# Patient Record
Sex: Female | Born: 1990
Health system: Southern US, Community
[De-identification: ages and names within clinical notes are randomized; demographics above are authoritative.]

## PROBLEM LIST (undated history)

## (undated) DIAGNOSIS — R569 Unspecified convulsions: Secondary | ICD-10-CM

## (undated) DIAGNOSIS — G40909 Epilepsy, unspecified, not intractable, without status epilepticus: Secondary | ICD-10-CM

---

## 2015-03-14 ENCOUNTER — Emergency Department (HOSPITAL_COMMUNITY)
Admission: EM | Admit: 2015-03-14 | Discharge: 2015-03-14 | Disposition: A | Discharge status: Home / Self Care | Payer: 59 | Attending: Emergency Medicine | Admitting: Emergency Medicine

## 2015-03-14 ENCOUNTER — Encounter (HOSPITAL_COMMUNITY): Payer: Self-pay | Admitting: Emergency Medicine

## 2015-03-14 DIAGNOSIS — G40909 Epilepsy, unspecified, not intractable, without status epilepticus: Secondary | ICD-10-CM | POA: Insufficient documentation

## 2015-03-14 DIAGNOSIS — R197 Diarrhea, unspecified: Secondary | ICD-10-CM | POA: Diagnosis not present

## 2015-03-14 DIAGNOSIS — R112 Nausea with vomiting, unspecified: Secondary | ICD-10-CM | POA: Insufficient documentation

## 2015-03-14 DIAGNOSIS — Z79899 Other long term (current) drug therapy: Secondary | ICD-10-CM | POA: Insufficient documentation

## 2015-03-14 DIAGNOSIS — Z3202 Encounter for pregnancy test, result negative: Secondary | ICD-10-CM | POA: Diagnosis not present

## 2015-03-14 HISTORY — DX: Epilepsy, unspecified, not intractable, without status epilepticus: G40.909

## 2015-03-14 HISTORY — DX: Unspecified convulsions: R56.9

## 2015-03-14 LAB — COMPREHENSIVE METABOLIC PANEL
ALBUMIN: 3.4 g/dL — AB (ref 3.5–5.0)
ALT: 36 U/L (ref 14–54)
AST: 59 U/L — AB (ref 15–41)
Alkaline Phosphatase: 58 U/L (ref 38–126)
Anion gap: 9 (ref 5–15)
BUN: 8 mg/dL (ref 6–20)
CHLORIDE: 105 mmol/L (ref 101–111)
CO2: 23 mmol/L (ref 22–32)
CREATININE: 0.86 mg/dL (ref 0.44–1.00)
Calcium: 9.1 mg/dL (ref 8.9–10.3)
GFR calc Af Amer: >60 mL/min (ref >60)
GFR calc non Af Amer: >60 mL/min (ref >60)
Glucose, Bld: 161 mg/dL — ABNORMAL HIGH (ref 65–99)
POTASSIUM: 3.5 mmol/L (ref 3.5–5.1)
SODIUM: 137 mmol/L (ref 135–145)
Total Bilirubin: 0.7 mg/dL (ref 0.3–1.2)
Total Protein: 6.8 g/dL (ref 6.5–8.1)

## 2015-03-14 LAB — URINALYSIS, ROUTINE W REFLEX MICROSCOPIC
Bilirubin Urine: NEGATIVE
GLUCOSE, UA: NEGATIVE mg/dL
HGB URINE DIPSTICK: NEGATIVE
Ketones, ur: NEGATIVE mg/dL
LEUKOCYTES UA: NEGATIVE
Nitrite: NEGATIVE
PH: 7 (ref 5.0–8.0)
Protein, ur: NEGATIVE mg/dL
Specific Gravity, Urine: 1.014 (ref 1.005–1.030)

## 2015-03-14 LAB — CBC
HEMATOCRIT: 36.2 % (ref 36.0–46.0)
Hemoglobin: 11.8 g/dL — ABNORMAL LOW (ref 12.0–15.0)
MCH: 26.2 pg (ref 26.0–34.0)
MCHC: 32.6 g/dL (ref 30.0–36.0)
MCV: 80.3 fL (ref 78.0–100.0)
Platelets: 206 10*3/uL (ref 150–400)
RBC: 4.51 MIL/uL (ref 3.87–5.11)
RDW: 13.7 % (ref 11.5–15.5)
WBC: 13.6 10*3/uL — AB (ref 4.0–10.5)

## 2015-03-14 LAB — LIPASE, BLOOD: LIPASE: 22 U/L (ref 11–51)

## 2015-03-14 LAB — I-STAT BETA HCG BLOOD, ED (MC, WL, AP ONLY): I-stat hCG, quantitative: <5 m[IU]/mL (ref <5)

## 2015-03-14 MED ORDER — SODIUM CHLORIDE 0.9 % IV BOLUS (SEPSIS)
1000.0000 mL | Freq: Once | INTRAVENOUS | Status: AC
  Administered 2015-03-14: 1000 mL via INTRAVENOUS

## 2015-03-14 MED ORDER — PROCHLORPERAZINE EDISYLATE 5 MG/ML IJ SOLN
10.0000 mg | Freq: Once | INTRAMUSCULAR | Status: AC
Start: 2015-03-14 — End: 2015-03-14
  Administered 2015-03-14: 10 mg via INTRAVENOUS
  Filled 2015-03-14: qty 2

## 2015-03-14 MED ORDER — ONDANSETRON HCL 4 MG/2ML IJ SOLN
4.0000 mg | Freq: Once | INTRAMUSCULAR | Status: AC | PRN
  Administered 2015-03-14: 4 mg via INTRAVENOUS
  Filled 2015-03-14: qty 2

## 2015-03-14 MED ORDER — ONDANSETRON HCL 4 MG/2ML IJ SOLN
4.0000 mg | Freq: Once | INTRAMUSCULAR | Status: AC
  Administered 2015-03-14: 4 mg via INTRAVENOUS
  Filled 2015-03-14: qty 2

## 2015-03-14 MED ORDER — KETOROLAC TROMETHAMINE 30 MG/ML IJ SOLN
30.0000 mg | Freq: Once | INTRAMUSCULAR | Status: AC
  Administered 2015-03-14: 30 mg via INTRAVENOUS
  Filled 2015-03-14: qty 1

## 2015-03-14 MED ORDER — ONDANSETRON 4 MG PO TBDP: 4.0000 mg | ORAL_TABLET | Freq: Three times a day (TID) | ORAL | Status: DC | PRN

## 2015-03-14 NOTE — ED Notes (Signed)
Pt here from home with c/o n/v/d , pt states that it started around midnight after she ate she ate some eggs , pt thought she could sleep it off but could not

## 2015-03-14 NOTE — Discharge Instructions (Signed)

## 2015-03-14 NOTE — ED Provider Notes (Signed)
CSN: [id number]     Arrival date & time 03/14/15  0510 History   First MD Initiated Contact with Patient 03/14/15 0535     Chief Complaint  Patient presents with  . Nausea  . Emesis     (Consider location/radiation/quality/duration/timing/severity/associated sxs/prior Treatment) HPI Comments: The patient is a 24 year old female, she has no significant past medical history other than seizures for which she takes Keppra. She reports that at approximately 12:00 yesterday she developed nausea vomiting and diarrhea almost immediately after eating eggs at a diner. No one else that was eating with her became sick. She has had several recurrent episodes of watery diarrhea since that meal as well as having multiple episodes of vomiting and dry heaving throughout the night. She has mild abdominal cramping, denies dysuria, denies pregnancy, denies fevers or chills. She has no prior abdominal surgery, has had no medications prior to arrival. The symptoms are persistent, nothing seems to make it better or worse.  Patient is a 24 y.o. female presenting with vomiting. The history is provided by the patient.  Emesis   Past Medical History  Diagnosis Date  . Epilepsy (HCC)   . Seizures (HCC)    History reviewed. No pertinent past surgical history. History reviewed. No pertinent family history. Social History  Substance Use Topics  . Smoking status: Never Smoker   . Smokeless tobacco: None  . Alcohol Use: No   OB History    No data available     Review of Systems  Gastrointestinal: Positive for vomiting.  All other systems reviewed and are negative.     Allergies  Review of patient's allergies indicates no known allergies.  Home Medications   Prior to Admission medications   Medication Sig Start Date End Date Taking? Authorizing Provider  levETIRAcetam (KEPPRA) 750 MG tablet Take 750 mg by mouth 2 (two) times daily.   Yes Historical Provider, MD  ondansetron (ZOFRAN ODT) 4 MG  disintegrating tablet Take 1 tablet (4 mg total) by mouth every 8 (eight) hours as needed for nausea. 03/14/15   Eber HongBrian Jacksyn Beeks, MD   BP 110/72 mmHg  Pulse 85  Resp 18  SpO2 100%  LMP 01/24/2015 (Approximate) Physical Exam  Constitutional: She appears well-developed and well-nourished. No distress.  HENT:  Head: Normocephalic and atraumatic.  Mouth/Throat: Oropharynx is clear and moist. No oropharyngeal exudate.  Eyes: Conjunctivae and EOM are normal. Pupils are equal, round, and reactive to light. Right eye exhibits no discharge. Left eye exhibits no discharge. No scleral icterus.  Neck: Normal range of motion. Neck supple. No JVD present. No thyromegaly present.  Cardiovascular: Normal rate, regular rhythm, normal heart sounds and intact distal pulses.  Exam reveals no gallop and no friction rub.   No murmur heard. Pulmonary/Chest: Effort normal and breath sounds normal. No respiratory distress. She has no wheezes. She has no rales.  Abdominal: Soft. Bowel sounds are normal. She exhibits no distension and no mass. There is tenderness ( Scant periumbilical tenderness, no pain at McBurney's point, no Murphy sign).  Musculoskeletal: Normal range of motion. She exhibits no edema or tenderness.  Lymphadenopathy:    She has no cervical adenopathy.  Neurological: She is alert. Coordination normal.  Skin: Skin is warm and dry. No rash noted. No erythema.  Psychiatric: She has a normal mood and affect. Her behavior is normal.  Nursing note and vitals reviewed.   ED Course  Procedures (including critical care time) Labs Review Labs Reviewed  COMPREHENSIVE METABOLIC PANEL - Abnormal; Notable  for the following:    Glucose, Bld 161 (*)    Albumin 3.4 (*)    AST 59 (*)    All other components within normal limits  CBC - Abnormal; Notable for the following:    WBC 13.6 (*)    Hemoglobin 11.8 (*)    All other components within normal limits  URINALYSIS, ROUTINE W REFLEX MICROSCOPIC (NOT AT  [facility]) - Abnormal; Notable for the following:    APPearance CLOUDY (*)    All other components within normal limits  LIPASE, BLOOD  I-STAT BETA HCG BLOOD, ED ([facility], [facility], [facility] ONLY)    Imaging Review No results found. I have personally reviewed and evaluated these images and lab results as part of my medical decision-making.    MDM   Final diagnoses:  Nausea vomiting and diarrhea     The patient's exam is rather unremarkable, she appears to have a gastroenteritis, consider viral, consider food borne illness, check potassium, liver function, blood counts, urinalysis, pregnancy.  Labs normal - no hypokalemia - mild leukocytosis.  Meds given in ED:  Medications  ondansetron (ZOFRAN) injection 4 mg (4 mg Intravenous Given 03/14/15 0534)  sodium chloride 0.9 % bolus 1,000 mL (1,000 mLs Intravenous New Bag/Given 03/14/15 0651)  sodium chloride 0.9 % bolus 1,000 mL (0 mLs Intravenous Stopped 03/14/15 0655)  ondansetron (ZOFRAN) injection 4 mg (4 mg Intravenous Given 03/14/15 0546)  ketorolac (TORADOL) 30 MG/ML injection 30 mg (30 mg Intravenous Given 03/14/15 0615)  prochlorperazine (COMPAZINE) injection 10 mg (10 mg Intravenous Given 03/14/15 0621)    New Prescriptions   ONDANSETRON (ZOFRAN ODT) 4 MG DISINTEGRATING TABLET    Take 1 tablet (4 mg total) by mouth every 8 (eight) hours as needed for nausea.        Eber Hong, MD 03/14/15 [phone]

## 2015-04-11 MED FILL — levETIRAcetam 750 MG TABS: 750 | 30 days supply | Qty: 60 | Fill #4

## 2015-05-11 MED FILL — ONDANSETRON ODT 4 MG TABLET: 4 | 4 days supply | Qty: 10 | Fill #0

## 2015-05-11 MED FILL — levETIRAcetam 750 MG TABS: 750 | 30 days supply | Qty: 60 | Fill #5

## 2015-05-20 DIAGNOSIS — Z113 Encounter for screening for infections with a predominantly sexual mode of transmission: Secondary | ICD-10-CM | POA: Diagnosis not present

## 2015-05-20 DIAGNOSIS — R87612 Low grade squamous intraepithelial lesion on cytologic smear of cervix (LGSIL): Secondary | ICD-10-CM | POA: Diagnosis not present

## 2015-05-20 DIAGNOSIS — Z124 Encounter for screening for malignant neoplasm of cervix: Secondary | ICD-10-CM | POA: Diagnosis not present

## 2015-05-20 DIAGNOSIS — Z01411 Encounter for gynecological examination (general) (routine) with abnormal findings: Secondary | ICD-10-CM | POA: Diagnosis not present

## 2015-05-20 DIAGNOSIS — Z6829 Body mass index (BMI) 29.0-29.9, adult: Secondary | ICD-10-CM | POA: Diagnosis not present

## 2015-05-20 DIAGNOSIS — Z3009 Encounter for other general counseling and advice on contraception: Secondary | ICD-10-CM | POA: Diagnosis not present

## 2015-05-20 MED FILL — TRI-PREVIFEM TABLET: 0.18/0.215/ | 84 days supply | Qty: 84 | Fill #0

## 2015-06-10 MED FILL — levETIRAcetam 750 MG TABS: 750 | 30 days supply | Qty: 60 | Fill #6

## 2015-06-15 DIAGNOSIS — R87612 Low grade squamous intraepithelial lesion on cytologic smear of cervix (LGSIL): Secondary | ICD-10-CM | POA: Diagnosis not present

## 2015-06-15 DIAGNOSIS — N898 Other specified noninflammatory disorders of vagina: Secondary | ICD-10-CM | POA: Diagnosis not present

## 2015-06-15 DIAGNOSIS — A630 Anogenital (venereal) warts: Secondary | ICD-10-CM | POA: Diagnosis not present

## 2015-06-15 DIAGNOSIS — A599 Trichomoniasis, unspecified: Secondary | ICD-10-CM | POA: Diagnosis not present

## 2015-06-15 DIAGNOSIS — N870 Mild cervical dysplasia: Secondary | ICD-10-CM | POA: Diagnosis not present

## 2015-06-15 MED FILL — IMIQUIMOD 5% CREAM PACKET: 5 | 24 days supply | Qty: 12 | Fill #0

## 2015-06-15 MED FILL — metroNIDAZOLE 500 MG TABS: 500 | 1 days supply | Qty: 4 | Fill #0

## 2015-07-11 MED FILL — levETIRAcetam 750 MG TABS: 750 | 30 days supply | Qty: 60 | Fill #7

## 2015-07-12 DIAGNOSIS — Z202 Contact with and (suspected) exposure to infections with a predominantly sexual mode of transmission: Secondary | ICD-10-CM | POA: Diagnosis not present

## 2015-08-01 MED FILL — TRI-PREVIFEM TABLET: 0.18/0.215/ | 84 days supply | Qty: 84 | Fill #1

## 2015-08-02 MED FILL — levETIRAcetam 750 MG TABS: 750 | 30 days supply | Qty: 60 | Fill #8

## 2015-09-09 ENCOUNTER — Emergency Department (HOSPITAL_COMMUNITY): Payer: 59

## 2015-09-09 ENCOUNTER — Encounter (HOSPITAL_COMMUNITY): Payer: Self-pay | Admitting: Emergency Medicine

## 2015-09-09 ENCOUNTER — Emergency Department (HOSPITAL_COMMUNITY)
Admission: EM | Admit: 2015-09-09 | Discharge: 2015-09-10 | Disposition: A | Discharge status: Home / Self Care | Payer: 59 | Attending: Emergency Medicine | Admitting: Emergency Medicine

## 2015-09-09 DIAGNOSIS — G40909 Epilepsy, unspecified, not intractable, without status epilepticus: Secondary | ICD-10-CM | POA: Diagnosis not present

## 2015-09-09 DIAGNOSIS — Z79899 Other long term (current) drug therapy: Secondary | ICD-10-CM | POA: Insufficient documentation

## 2015-09-09 DIAGNOSIS — X500XXA Overexertion from strenuous movement or load, initial encounter: Secondary | ICD-10-CM | POA: Diagnosis not present

## 2015-09-09 DIAGNOSIS — Y9289 Other specified places as the place of occurrence of the external cause: Secondary | ICD-10-CM | POA: Diagnosis not present

## 2015-09-09 DIAGNOSIS — S4991XA Unspecified injury of right shoulder and upper arm, initial encounter: Secondary | ICD-10-CM | POA: Diagnosis present

## 2015-09-09 DIAGNOSIS — M24411 Recurrent dislocation, right shoulder: Secondary | ICD-10-CM | POA: Diagnosis not present

## 2015-09-09 DIAGNOSIS — Y9389 Activity, other specified: Secondary | ICD-10-CM | POA: Insufficient documentation

## 2015-09-09 DIAGNOSIS — S43014A Anterior dislocation of right humerus, initial encounter: Secondary | ICD-10-CM | POA: Diagnosis not present

## 2015-09-09 DIAGNOSIS — S43004A Unspecified dislocation of right shoulder joint, initial encounter: Secondary | ICD-10-CM

## 2015-09-09 DIAGNOSIS — S43005A Unspecified dislocation of left shoulder joint, initial encounter: Secondary | ICD-10-CM | POA: Diagnosis not present

## 2015-09-09 DIAGNOSIS — Y998 Other external cause status: Secondary | ICD-10-CM | POA: Insufficient documentation

## 2015-09-09 MED FILL — levETIRAcetam 750 MG TABS: 750 | 30 days supply | Qty: 60 | Fill #9

## 2015-09-09 NOTE — ED Provider Notes (Signed)
CSN: [id number]     Arrival date & time 09/09/15  2204 History  By signing my name below, I, Phillis HaggisGabriella Gaje, attest that this documentation has been prepared under the direction and in the presence of Benjiman CoreNathan Meghann Landing, MD. Electronically Signed: Phillis HaggisGabriella Gaje, ED Scribe. 09/09/2015. 12:29 AM.   Chief Complaint  Patient presents with  . Shoulder Pain   The history is provided by the patient. No language interpreter was used.  HPI Comments: Courtney Hudson is a 25 y.o. Female with a hx of epilepsy who presents to the Emergency Department complaining of right shoulder pain onset 2 hours ago. Pt reports injuring her right shoulder while lifting weights above her head. She reports a hx of 5 shoulder dislocations on both arms. [place] in [place], but she has not followed up with him in years. Pt last ate at 4 PM. She denies numbness or weakness. Pt denies chance of pregnancy.    Past Medical History  Diagnosis Date  . Epilepsy (HCC)   . Seizures (HCC)    History reviewed. No pertinent past surgical history. No family history on file. Social History  Substance Use Topics  . Smoking status: Never Smoker   . Smokeless tobacco: None  . Alcohol Use: No   OB History    No data available     Review of Systems  Musculoskeletal: Positive for joint swelling and arthralgias.  Neurological: Negative for weakness and numbness.  All other systems reviewed and are negative.  Allergies  Other  Home Medications   Prior to Admission medications   Medication Sig Start Date End Date Taking? Authorizing Provider  levETIRAcetam (KEPPRA) 750 MG tablet Take 1,500 mg by mouth at bedtime.    Yes Historical Provider, MD  Multiple Vitamins-Minerals (HAIR SKIN NAILS PO) Take 1 tablet by mouth daily.   Yes Historical Provider, MD   BP 126/87 mmHg  Pulse 81  Temp(Src) 97.6 F (36.4 C) (Oral)  Resp 18  Wt 155 lb (70.308 kg)  SpO2 100%  LMP 08/31/2015 Physical Exam  Constitutional: She is  oriented to person, place, and time. She appears well-developed and well-nourished.  HENT:  Head: Normocephalic and atraumatic.  Eyes: EOM are normal. Pupils are equal, round, and reactive to light.  Neck: Normal range of motion. Neck supple.  Cardiovascular: Normal rate, regular rhythm and normal heart sounds.  Exam reveals no gallop and no friction rub.   No murmur heard. Pulmonary/Chest: Effort normal and breath sounds normal. She has no wheezes.  Abdominal: Soft. There is no tenderness.  Musculoskeletal:  NVI in right hand; squaring in right shoulder with hollowness laterally and fullness anteriorly; no tenderness over the elbow  Neurological: She is alert and oriented to person, place, and time.  Skin: Skin is warm and dry.  Psychiatric: She has a normal mood and affect. Her behavior is normal.  Nursing note and vitals reviewed.   ED Course  ORTHOPEDIC INJURY TREATMENT Date/Time: 09/10/2015 1:00 AM Performed by: Benjiman CorePICKERING, Rashana Andrew Authorized by: Benjiman CorePICKERING, Ronny Korff Consent: Verbal consent obtained. Written consent obtained. Risks and benefits: risks, benefits and alternatives were discussed Consent given by: patient Required items: required blood products, implants, devices, and special equipment available Patient identity confirmed: verbally with patient and arm band Time out: Immediately prior to procedure a "time out" was called to verify the correct patient, procedure, equipment, support staff and site/side marked as required. Injury location: shoulder Location details: right shoulder Injury type: dislocation Dislocation type: anterior Hill-Sachs deformity: yes Chronicity:  recurrent Pre-procedure neurovascular assessment: neurovascularly intact Pre-procedure distal perfusion: normal Pre-procedure neurological function: normal Pre-procedure range of motion: reduced Local anesthesia used: no Patient sedated: yes Sedation type: moderate (conscious) sedation Sedatives:  propofol Sedation start date/time: 09/10/2015 1:00 AM Vitals: Vital signs were monitored during sedation. Manipulation performed: yes Reduction method: traction and counter traction Reduction successful: yes X-ray confirmed reduction: yes Immobilization: sling Post-procedure neurovascular assessment: post-procedure neurovascularly intact Post-procedure distal perfusion: normal Post-procedure neurological function: normal Post-procedure range of motion: improved Patient tolerance: Patient tolerated the procedure well with no immediate complications   (including critical care time) DIAGNOSTIC STUDIES: Oxygen Saturation is 100% on RA, normal by my interpretation.    COORDINATION OF CARE: 12:24 AM-Discussed treatment plan which includes x-ray and reduction of shoulder with pt at bedside and pt agreed to plan.      Labs Review Labs Reviewed - No data to display  Imaging Review Dg Shoulder Right  09/09/2015  CLINICAL DATA:  Shoulder dislocation while lifting weights. Initial encounter. EXAM: RIGHT SHOULDER - 2+ VIEW COMPARISON:  None. FINDINGS: Anterior glenohumeral dislocation without visible fracture. Please note that the images are labeled as left, but the symptomatic right side was imaged. IMPRESSION: Right anterior glenohumeral dislocation. Electronically Signed   By: Marnee Spring M.D.   On: 09/09/2015 23:02   Dg Shoulder Right Port  09/10/2015  CLINICAL DATA:  Status post reduction of right glenohumeral joint dislocation. Initial encounter. EXAM: PORTABLE RIGHT SHOULDER - 2+ VIEW COMPARISON:  Right shoulder radiographs performed 09/09/2015 FINDINGS: There has been successful reduction of the right humeral head dislocation. An underlying large Hill-Sachs lesion is noted. No osseous Bankart lesion is seen. The right acromioclavicular joint is unremarkable in appearance. The lungs are hypoexpanded. IMPRESSION: Successful reduction of right humeral head dislocation. Underlying large  Hill-Sachs lesion seen. No osseous Bankart lesion identified. Electronically Signed   By: Roanna Raider M.D.   On: 09/10/2015 01:35   I have personally reviewed and evaluated these images and lab results as part of my medical decision-making.   EKG Interpretation None      MDM   Final diagnoses:  Shoulder dislocation, right, initial encounter    Patient with recurrent shoulder dislocation. Previous history of same. Reduced in ER. Discharged home to follow-up with her orthopedic surgeon. I personally performed the services described in this documentation, which was scribed in my presence. The recorded information has been reviewed and considered.  I personally performed the services described in this documentation, which was scribed in my presence. The recorded information has been reviewed and is accurate.   Ni   Benjiman Core, MD 09/10/15 [phone]

## 2015-09-09 NOTE — ED Notes (Signed)
C/o R shoulder pain/ ? Dislocation.  States she injured it lifting weights just pta.

## 2015-09-10 ENCOUNTER — Emergency Department (HOSPITAL_COMMUNITY): Payer: 59

## 2015-09-10 DIAGNOSIS — S43014A Anterior dislocation of right humerus, initial encounter: Secondary | ICD-10-CM | POA: Diagnosis not present

## 2015-09-10 DIAGNOSIS — Z79899 Other long term (current) drug therapy: Secondary | ICD-10-CM | POA: Diagnosis not present

## 2015-09-10 DIAGNOSIS — S43004A Unspecified dislocation of right shoulder joint, initial encounter: Secondary | ICD-10-CM | POA: Diagnosis not present

## 2015-09-10 DIAGNOSIS — G40909 Epilepsy, unspecified, not intractable, without status epilepticus: Secondary | ICD-10-CM | POA: Diagnosis not present

## 2015-09-10 MED ORDER — PROPOFOL 10 MG/ML IV BOLUS
0.5000 mg/kg | Freq: Once | INTRAVENOUS | Status: AC
  Administered 2015-09-10: 30 mg via INTRAVENOUS
  Filled 2015-09-10: qty 20

## 2015-09-10 MED ORDER — PROPOFOL 10 MG/ML IV BOLUS
0.5000 mg/kg | Freq: Once | INTRAVENOUS | Status: DC
  Administered 2015-09-10: 30 mg via INTRAVENOUS

## 2015-09-10 MED ORDER — PROPOFOL 10 MG/ML IV BOLUS
INTRAVENOUS | Status: AC | PRN
  Administered 2015-09-10: 40 mg via INTRAVENOUS

## 2015-09-10 NOTE — Discharge Instructions (Signed)
Shoulder Dislocation °A shoulder dislocation happens when the upper arm bone (humerus) moves out of the shoulder joint. The shoulder joint is the part of the shoulder where the humerus, shoulder blade (scapula), and collarbone (clavicle) meet. °CAUSES °This condition is often caused by: °· A fall. °· A hit to the shoulder. °· A forceful movement of the shoulder. °RISK FACTORS °This condition is more likely to develop in people who play sports. °SYMPTOMS °Symptoms of this condition include: °· Deformity of the shoulder. °· Intense pain. °· Inability to move the shoulder. °· Numbness, weakness, or tingling in your neck or down your arm. °· Bruising or swelling around your shoulder. °DIAGNOSIS °This condition is diagnosed with a physical exam. After the exam, tests may be done to check for related problems. Tests that may be done include: °· X-ray. This may be done to check for broken bones. °· MRI. This may be done to check for damage to the tissues around the shoulder. °· Electromyogram. This may be done to check for nerve damage. °TREATMENT °This condition is treated with a procedure to place the humerus back in the joint. This procedure is called a reduction. There are two types of reduction: °· Closed reduction. In this procedure, the humerus is placed back in the joint without surgery. The health care provider uses his or her hands to guide the bone back into place. °· Open reduction. In this procedure, the humerus is placed back in the joint with surgery. An open reduction may be recommended if: °¨ You have a weak shoulder joint or weak ligaments. °¨ You have had more than one shoulder dislocation. °¨ The nerves or blood vessels around your shoulder have been damaged. °After the humerus is placed back into the joint, your arm will be placed in a splint or sling to prevent it from moving. You will need to wear the splint or sling until your shoulder heals. When the splint or sling is removed, you may have  physical therapy to help improve the range of motion in your shoulder joint. °HOME CARE INSTRUCTIONS °If You Have a Splint or Sling: °· Wear it as told by your health care provider. Remove it only as told by your health care provider. °· Loosen it if your fingers become numb and tingle, or if they turn cold and blue. °· Keep it clean and dry. °Bathing °· Do not take baths, swim, or use a hot tub until your health care provider approves. Ask your health care provider if you can take showers. You may only be allowed to take sponge baths for bathing. °· If your health care provider approves bathing and showering, cover your splint or sling with a watertight plastic bag to protect it from water. Do not let the splint or sling get wet. °Managing Pain, Stiffness, and Swelling °· If directed, apply ice to the injured area. °¨ Put ice in a plastic bag. °¨ Place a towel between your skin and the bag. °¨ Leave the ice on for 20 minutes, 2-3 times per day. °· Move your fingers often to avoid stiffness and to decrease swelling. °· Raise (elevate) the injured area above the level of your heart while you are sitting or lying down. °Driving °· Do not drive while wearing a splint or sling on a hand that you use for driving. °· Do not drive or operate heavy machinery while taking pain medicine. °Activity °· Return to your normal activities as told by your health care provider. Ask your   health care provider what activities are safe for you. °· Perform range-of-motion exercises only as told by your health care provider. °· Exercise your hand by squeezing a soft ball. This helps to decrease stiffness and swelling in your hand and wrist. °General Instructions °· Take over-the-counter and prescription medicines only as told by your health care provider. °· Do not use any tobacco products, including cigarettes, chewing tobacco, or e-cigarettes. Tobacco can delay bone and tissue healing. If you need help quitting, ask your health care  provider. °· Keep all follow-up visits as told by your health care provider. This is important. °SEEK MEDICAL CARE IF: °· Your splint or sling gets damaged. °SEEK IMMEDIATE MEDICAL CARE IF: °· Your pain gets worse rather than better. °· You lose feeling in your arm or hand. °· Your arm or hand becomes white and cold. °  °This information is not intended to replace advice given to you by your health care provider. Make sure you discuss any questions you have with your health care provider. °  °Document Released: 12/19/2000 Document Revised: 12/15/2014 Document Reviewed: 07/19/2014 °Elsevier Interactive Patient Education ©2016 [employer] ° °

## 2015-09-10 NOTE — ED Notes (Signed)
Supplies for sedation at bedside

## 2015-09-10 NOTE — ED Notes (Signed)
Signed consent at bedside.  Airway cart and code cart outside room.  Pt placed on cardiac monitor and CO2 monitor.

## 2015-09-10 NOTE — ED Notes (Signed)
Patient able to ambulate independently  

## 2015-09-10 NOTE — ED Notes (Signed)
MD at bedside. 

## 2015-09-10 NOTE — ED Notes (Signed)
X-ray at bedside

## 2015-09-27 DIAGNOSIS — Z7251 High risk heterosexual behavior: Secondary | ICD-10-CM | POA: Diagnosis not present

## 2015-10-06 MED FILL — levETIRAcetam 750 MG TABS: 750 | 30 days supply | Qty: 60 | Fill #10

## 2015-11-04 MED FILL — levETIRAcetam 750 MG TABS: 750 | 30 days supply | Qty: 60 | Fill #11

## 2015-11-07 MED FILL — TRI-PREVIFEM TABLET: 0.18/0.215/ | 84 days supply | Qty: 84 | Fill #2

## 2015-12-01 MED FILL — levETIRAcetam 750 MG TABS: 750 | 30 days supply | Qty: 60 | Fill #0

## 2015-12-10 DIAGNOSIS — J019 Acute sinusitis, unspecified: Secondary | ICD-10-CM | POA: Diagnosis not present

## 2016-01-05 MED FILL — levETIRAcetam 750 MG TABS: 750 | 30 days supply | Qty: 60 | Fill #1

## 2016-01-27 MED FILL — TRI-PREVIFEM TABLET: 0.18/0.215/ | 84 days supply | Qty: 84 | Fill #3

## 2016-02-03 MED FILL — levETIRAcetam 750 MG TABS: 750 | 30 days supply | Qty: 60 | Fill #2

## 2016-02-13 DIAGNOSIS — G40309 Generalized idiopathic epilepsy and epileptic syndromes, not intractable, without status epilepticus: Secondary | ICD-10-CM | POA: Diagnosis not present

## 2016-02-28 MED FILL — levETIRAcetam 750 MG TABS: 750 | 30 days supply | Qty: 60 | Fill #3

## 2016-03-30 MED FILL — levETIRAcetam 750 MG TABS: 750 | 30 days supply | Qty: 60 | Fill #4

## 2016-04-24 MED FILL — TRI-PREVIFEM TABLET: 0.18/0.215/ | 28 days supply | Qty: 28 | Fill #0

## 2016-04-27 MED FILL — levETIRAcetam 750 MG TABS: 750 | 30 days supply | Qty: 60 | Fill #0

## 2016-05-25 MED FILL — TRI-PREVIFEM TABLET: 0.18/0.215/ | 84 days supply | Qty: 84 | Fill #0

## 2016-05-28 MED FILL — levETIRAcetam 750 MG TABS: 750 | 30 days supply | Qty: 60 | Fill #1

## 2016-06-04 IMAGING — CR DG SHOULDER 2+V PORT*R*
2 series · 2 of 2 positions shown · non-contrast
Comparison: Right shoulder radiographs performed 09/09/2015

CLINICAL DATA: Status post reduction of right glenohumeral joint
dislocation. Initial encounter.

EXAM:
PORTABLE RIGHT SHOULDER - 2+ VIEW

[AP (1 of 2)]
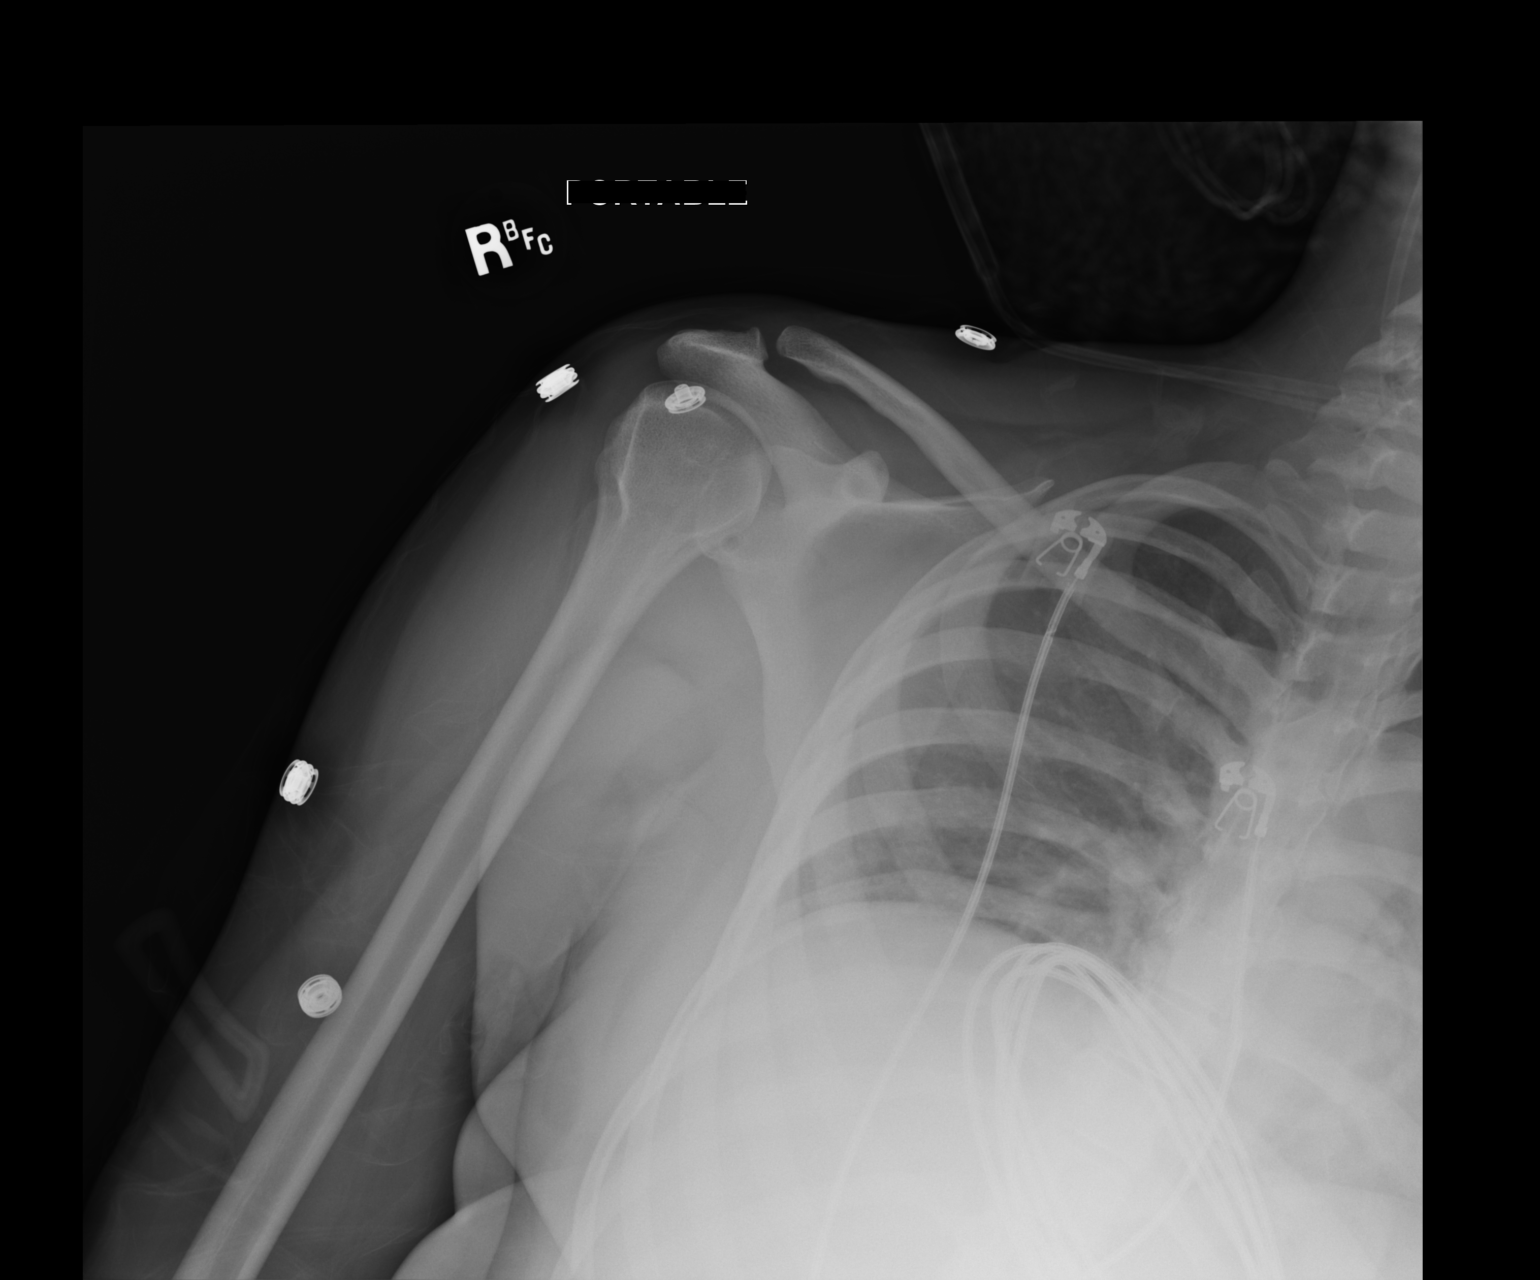

[AP (2 of 2)]
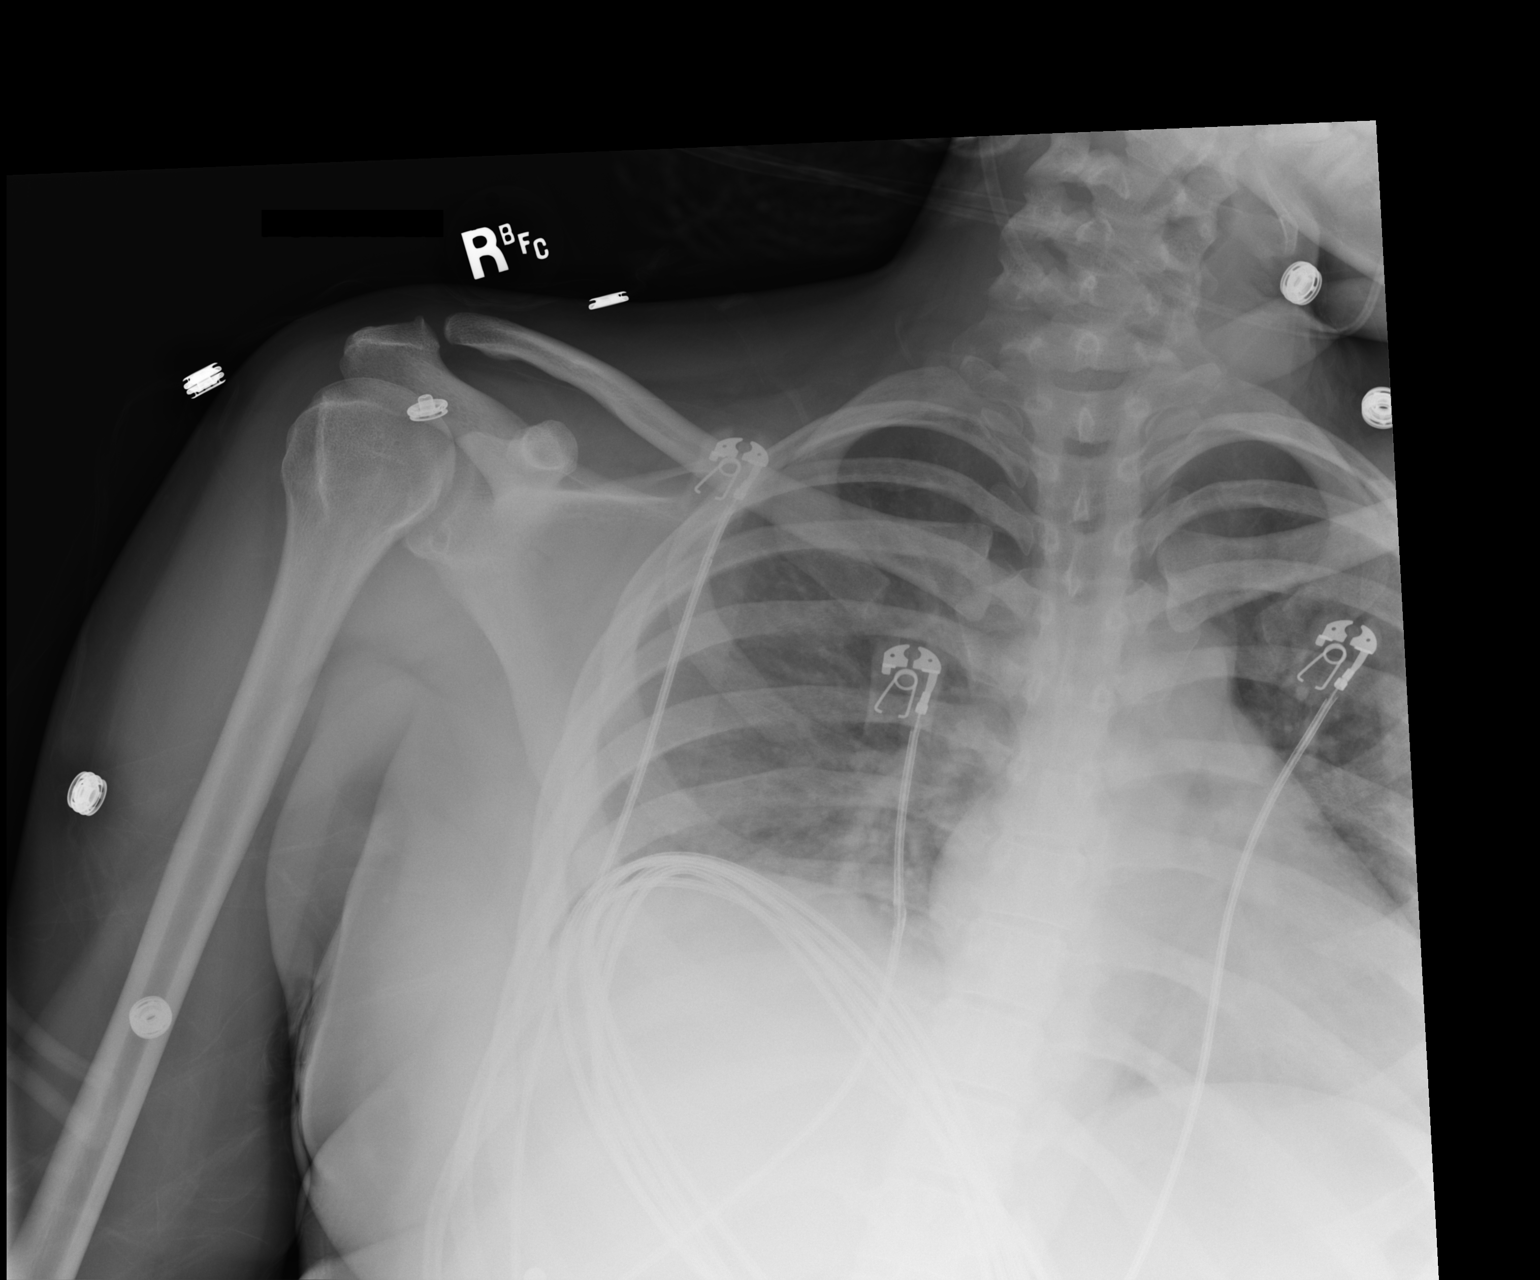

[2 of 2 positions shown; findings below may reference images not displayed]

FINDINGS: There has been successful reduction of the right humeral head
dislocation. An underlying large Hill-Sachs lesion is noted. No
osseous Bankart lesion is seen. The right acromioclavicular joint is
unremarkable in appearance. The lungs are hypoexpanded.
IMPRESSION: Successful reduction of right humeral head dislocation. Underlying
large Hill-Sachs lesion seen. No osseous Bankart lesion identified.

## 2016-07-03 MED FILL — levETIRAcetam 750 MG TABS: 750 | 30 days supply | Qty: 60 | Fill #2

## 2016-07-31 MED FILL — TRI-PREVIFEM TABLET: 0.18/0.215/ | 84 days supply | Qty: 84 | Fill #1 | Status: TO

## 2016-07-31 MED FILL — levETIRAcetam 750 MG TABS: 750 | 30 days supply | Qty: 60 | Fill #3

## 2016-08-31 MED FILL — levETIRAcetam 750 MG TABS: 750 | 30 days supply | Qty: 60 | Fill #4 | Status: TO
